# Patient Record
Sex: Female | Born: 1955
Health system: Southern US, Community
[De-identification: ages and names within clinical notes are randomized; demographics above are authoritative.]

## PROBLEM LIST (undated history)

## (undated) DIAGNOSIS — F32A Depression, unspecified: Secondary | ICD-10-CM

## (undated) DIAGNOSIS — F329 Major depressive disorder, single episode, unspecified: Secondary | ICD-10-CM

## (undated) DIAGNOSIS — E119 Type 2 diabetes mellitus without complications: Secondary | ICD-10-CM

## (undated) DIAGNOSIS — G4730 Sleep apnea, unspecified: Secondary | ICD-10-CM

## (undated) DIAGNOSIS — E039 Hypothyroidism, unspecified: Secondary | ICD-10-CM

## (undated) DIAGNOSIS — C801 Malignant (primary) neoplasm, unspecified: Secondary | ICD-10-CM

## (undated) HISTORY — PX: SALPINGECTOMY: SHX328

## (undated) HISTORY — PX: DILATION AND CURETTAGE OF UTERUS: SHX78

---

## 2007-03-29 ENCOUNTER — Emergency Department: Payer: Self-pay | Admitting: Emergency Medicine

## 2007-03-29 ENCOUNTER — Other Ambulatory Visit: Payer: Self-pay

## 2013-08-21 ENCOUNTER — Ambulatory Visit: Payer: Self-pay | Admitting: Family Medicine

## 2015-06-18 ENCOUNTER — Other Ambulatory Visit: Payer: Self-pay | Admitting: Family Medicine

## 2015-06-18 DIAGNOSIS — R1084 Generalized abdominal pain: Secondary | ICD-10-CM

## 2015-06-18 DIAGNOSIS — E038 Other specified hypothyroidism: Secondary | ICD-10-CM

## 2015-06-22 ENCOUNTER — Ambulatory Visit
Admission: RE | Admit: 2015-06-22 | Discharge: 2015-06-22 | Disposition: A | Discharge status: Home / Self Care | Payer: BLUE CROSS/BLUE SHIELD | Source: Ambulatory Visit | Attending: Family Medicine | Admitting: Family Medicine

## 2015-06-22 DIAGNOSIS — K760 Fatty (change of) liver, not elsewhere classified: Secondary | ICD-10-CM | POA: Insufficient documentation

## 2015-06-22 DIAGNOSIS — I2510 Atherosclerotic heart disease of native coronary artery without angina pectoris: Secondary | ICD-10-CM | POA: Insufficient documentation

## 2015-06-22 DIAGNOSIS — R938 Abnormal findings on diagnostic imaging of other specified body structures: Secondary | ICD-10-CM | POA: Insufficient documentation

## 2015-06-22 DIAGNOSIS — R160 Hepatomegaly, not elsewhere classified: Secondary | ICD-10-CM | POA: Diagnosis not present

## 2015-06-22 DIAGNOSIS — R1084 Generalized abdominal pain: Secondary | ICD-10-CM | POA: Diagnosis present

## 2015-06-22 DIAGNOSIS — K449 Diaphragmatic hernia without obstruction or gangrene: Secondary | ICD-10-CM | POA: Diagnosis not present

## 2015-06-22 DIAGNOSIS — E038 Other specified hypothyroidism: Secondary | ICD-10-CM

## 2015-06-22 MED ORDER — IOPAMIDOL (ISOVUE-300) INJECTION 61%
100.0000 mL | Freq: Once | INTRAVENOUS | Status: AC | PRN
  Administered 2015-06-22: 100 mL via INTRAVENOUS

## 2015-09-21 ENCOUNTER — Other Ambulatory Visit: Payer: BLUE CROSS/BLUE SHIELD

## 2015-09-22 ENCOUNTER — Other Ambulatory Visit: Payer: Self-pay

## 2015-09-22 ENCOUNTER — Encounter
Admission: RE | Admit: 2015-09-22 | Discharge: 2015-09-22 | Disposition: A | Discharge status: Home / Self Care | Payer: BLUE CROSS/BLUE SHIELD | Source: Ambulatory Visit | Attending: Obstetrics and Gynecology | Admitting: Obstetrics and Gynecology

## 2015-09-22 DIAGNOSIS — Z01810 Encounter for preprocedural cardiovascular examination: Secondary | ICD-10-CM | POA: Insufficient documentation

## 2015-09-22 DIAGNOSIS — Z01812 Encounter for preprocedural laboratory examination: Secondary | ICD-10-CM | POA: Insufficient documentation

## 2015-09-22 HISTORY — DX: Hypothyroidism, unspecified: E03.9

## 2015-09-22 HISTORY — DX: Type 2 diabetes mellitus without complications: E11.9

## 2015-09-22 HISTORY — DX: Sleep apnea, unspecified: G47.30

## 2015-09-22 LAB — COMPREHENSIVE METABOLIC PANEL
ALK PHOS: 69 U/L (ref 38–126)
ALT: 43 U/L (ref 14–54)
ANION GAP: 8 (ref 5–15)
AST: 32 U/L (ref 15–41)
Albumin: 4.6 g/dL (ref 3.5–5.0)
BILIRUBIN TOTAL: 0.9 mg/dL (ref 0.3–1.2)
BUN: 16 mg/dL (ref 6–20)
CALCIUM: 9.4 mg/dL (ref 8.9–10.3)
CO2: 26 mmol/L (ref 22–32)
Chloride: 109 mmol/L (ref 101–111)
Creatinine, Ser: 0.42 mg/dL — ABNORMAL LOW (ref 0.44–1.00)
Glucose, Bld: 102 mg/dL — ABNORMAL HIGH (ref 65–99)
POTASSIUM: 3.7 mmol/L (ref 3.5–5.1)
Sodium: 143 mmol/L (ref 135–145)
TOTAL PROTEIN: 7.4 g/dL (ref 6.5–8.1)

## 2015-09-22 LAB — CBC
HEMATOCRIT: 46.3 % (ref 35.0–47.0)
HEMOGLOBIN: 15.6 g/dL (ref 12.0–16.0)
MCH: 30.1 pg (ref 26.0–34.0)
MCHC: 33.8 g/dL (ref 32.0–36.0)
MCV: 89.2 fL (ref 80.0–100.0)
Platelets: 269 10*3/uL (ref 150–440)
RBC: 5.19 MIL/uL (ref 3.80–5.20)
RDW: 13.1 % (ref 11.5–14.5)
WBC: 7.5 10*3/uL (ref 3.6–11.0)

## 2015-09-22 LAB — TYPE AND SCREEN
ABO/RH(D): O POS
ANTIBODY SCREEN: NEGATIVE

## 2015-09-22 NOTE — Patient Instructions (Signed)
Your procedure is scheduled on: 10-06-2015 Su procedimiento est programado para: Report to Tina Reynolds. Presntese a: To find out your arrival time please call [phone] between 1PM - 3PM on Monday 10-05-2015 Para saber su hora de llegada por favor llame al ([place]:  Remember: Instructions that are not followed completely may result in serious medical risk, up to and including death, or upon the discretion of your surgeon and anesthesiologist your surgery may need to be rescheduled.  Recuerde: Las instrucciones que no se siguen completamente Heritage manager en un riesgo de salud grave, incluyendo hasta la [place] o a discrecin de su cirujano y Environmental health practitioner, su ciruga se puede posponer.   __X__ 1. Do not eat food or drink liquids after midnight. No gum chewing or hard candies.  No coma alimentos ni tome lquidos despus de la medianoche.  No mastique chicle ni caramelos  duros.     __X__ 2. No alcohol for 24 hours before or after surgery.    No tome alcohol durante las 24 horas antes ni despus de la [place].   _X___ 3. Bring all medications with you on the day of surgery if instructed.    Lleve todos los medicamentos con usted el da de su ciruga si se le ha indicado as.   __X__ 4. Notify your doctor if there is any change in your medical condition (cold, fever,                             infections).    Informe a su mdico si hay algn cambio en su condicin mdica (resfriado, fiebre, infecciones).   Do not wear jewelry, make-up, hairpins, clips or nail polish.  No use joyas, maquillajes, pinzas/ganchos para el cabello ni esmalte de uas.  Do not wear lotions, powders, or perfumes. You may wear deodorant.  No use lociones, polvos o perfumes.  Puede usar desodorante.    Do not shave 48 hours prior to surgery. Men may shave face and neck.  No se afeite 48 horas antes de la [place].  Los  hombres pueden [employer] cara y el cuello.   Do not bring valuables to the hospital.   No lleve objetos [facility] is not responsible for any belongings or valuables.  [place] no se hace responsable de ningn tipo de pertenencias u objetos de Geographical information systems officer.               Contacts, dentures or bridgework may not be worn into surgery.  Los lentes de [place], las dentaduras postizas o puentes no se pueden usar en la [place].  Leave your suitcase in the car. After surgery it may be brought to your room.  Deje su maleta en el auto.  Despus de la ciruga podr traerla a su habitacin.  For patients admitted to the hospital, discharge time is determined by your treatment team.  Para los pacientes que sean ingresados al hospital, el tiempo en el cual se le dar de alta es determinado por su                equipo de [place].   Patients discharged the day of surgery will not be allowed to drive home. A los pacientes que se les da de alta el mismo da de la ciruga no se les permitir conducir a Holiday representative.  Please read over the following fact sheets that you were given: Por favor [place] informacin que le dieron:   Surgical Site Infection Prevention   _X___ Take these medicines the morning of surgery with A SIP OF WATER:          [employer] estas medicinas la maana de la ciruga con UN SORBO DE AGUA:  1.atorvastatin (LIPITOR) 40 MG tablet  2. citalopram (CELEXA              3. fenofibrate (TRICOR)      4.levothyroxine (SYNTHROID,            Fleet Enema (as directed)          Enema de [place] (segn lo indicado)    ____ Use CHG Soap as directed          Utilice el jabn de CHG segn lo indicado  ____ Use inhalers on the day of surgery          Use los inhaladores el da de la ciruga  ____ Stop metformin 2 days prior to surgery          Deje de tomar el metformin 2 das antes de la ciruga    ____ Take 1/2 of usual insulin dose the night before  surgery and none on the morning of surgery           Tome la mitad de la dosis habitual de insulina la noche antes de la [place] y no tome nada en la maana de la             ciruga  ____ Stop Coumadin/Plavix/aspirin on        Deje de tomar el Coumadin/Plavix/aspirina el da:  ___X_ Stop Anti-inflammatories TODAY NO ADVIL MOTRIN, IBUPROFEN OR ALEVE --- ONLY TAKE TYLENOL          Deje de tomar antiinflamatorios el da:   ____ Stop supplements until after surgery            Deje de tomar suplementos hasta despus de la ciruga  ____ Bring C-Pap to the hospital          [facility]

## 2015-09-22 NOTE — Pre-Procedure Instructions (Signed)
MEDICAL CLEARANCE REQUEST/ EKG AS INSTRUCTED BY DR Linden FAXED TO DR Casimer Lanius. ALSO CALLED AND FAXED TO [place]

## 2015-09-29 NOTE — Pre-Procedure Instructions (Signed)
Medical clearance received from Dr. Lestine Box, and patient cleared at low risk for surgery.

## 2015-10-06 ENCOUNTER — Ambulatory Visit: Payer: MEDICAID | Admitting: Anesthesiology

## 2015-10-06 ENCOUNTER — Encounter
Admission: RE | Disposition: A | Discharge status: Home / Self Care | Payer: Self-pay | Source: Ambulatory Visit | Attending: Obstetrics and Gynecology

## 2015-10-06 ENCOUNTER — Ambulatory Visit
Admission: RE | Admit: 2015-10-06 | Discharge: 2015-10-06 | Disposition: A | Discharge status: Home / Self Care | Payer: MEDICAID | Source: Ambulatory Visit | Attending: Obstetrics and Gynecology | Admitting: Obstetrics and Gynecology

## 2015-10-06 ENCOUNTER — Encounter: Payer: Self-pay | Admitting: *Deleted

## 2015-10-06 DIAGNOSIS — Z79899 Other long term (current) drug therapy: Secondary | ICD-10-CM | POA: Insufficient documentation

## 2015-10-06 DIAGNOSIS — N840 Polyp of corpus uteri: Secondary | ICD-10-CM | POA: Diagnosis present

## 2015-10-06 DIAGNOSIS — F329 Major depressive disorder, single episode, unspecified: Secondary | ICD-10-CM | POA: Insufficient documentation

## 2015-10-06 DIAGNOSIS — N841 Polyp of cervix uteri: Secondary | ICD-10-CM | POA: Diagnosis present

## 2015-10-06 DIAGNOSIS — E039 Hypothyroidism, unspecified: Secondary | ICD-10-CM | POA: Insufficient documentation

## 2015-10-06 DIAGNOSIS — E785 Hyperlipidemia, unspecified: Secondary | ICD-10-CM | POA: Insufficient documentation

## 2015-10-06 HISTORY — PX: DILATATION & CURETTAGE/HYSTEROSCOPY WITH MYOSURE: SHX6511

## 2015-10-06 LAB — GLUCOSE, CAPILLARY: Glucose-Capillary: 90 mg/dL (ref 65–99)

## 2015-10-06 SURGERY — DILATATION & CURETTAGE/HYSTEROSCOPY WITH MYOSURE
Anesthesia: General

## 2015-10-06 MED ORDER — FENTANYL CITRATE (PF) 100 MCG/2ML IJ SOLN
INTRAMUSCULAR | Status: DC | PRN
  Administered 2015-10-06: 25 ug via INTRAVENOUS
  Administered 2015-10-06: 12.5 ug via INTRAVENOUS

## 2015-10-06 MED ORDER — FAMOTIDINE 20 MG PO TABS
20.0000 mg | ORAL_TABLET | Freq: Once | ORAL | Status: AC
  Administered 2015-10-06: 20 mg via ORAL

## 2015-10-06 MED ORDER — OXYCODONE HCL 5 MG/5ML PO SOLN: 5.0000 mg | Freq: Once | ORAL | Status: DC | PRN

## 2015-10-06 MED ORDER — OXYCODONE HCL 5 MG PO TABS: 5.0000 mg | ORAL_TABLET | Freq: Once | ORAL | Status: DC | PRN

## 2015-10-06 MED ORDER — FAMOTIDINE 20 MG PO TABS
ORAL_TABLET | ORAL | Status: AC
  Administered 2015-10-06: 20 mg via ORAL
  Filled 2015-10-06: qty 1

## 2015-10-06 MED ORDER — MIDAZOLAM HCL 2 MG/2ML IJ SOLN
INTRAMUSCULAR | Status: DC | PRN
  Administered 2015-10-06: 1 mg via INTRAVENOUS

## 2015-10-06 MED ORDER — LIDOCAINE HCL (CARDIAC) 20 MG/ML IV SOLN
INTRAVENOUS | Status: DC | PRN
  Administered 2015-10-06: 80 mg via INTRAVENOUS

## 2015-10-06 MED ORDER — PROPOFOL 10 MG/ML IV BOLUS
INTRAVENOUS | Status: DC | PRN
  Administered 2015-10-06: 150 mg via INTRAVENOUS

## 2015-10-06 MED ORDER — SILVER NITRATE-POT NITRATE 75-25 % EX MISC
CUTANEOUS | Status: DC | PRN
  Administered 2015-10-06: 2

## 2015-10-06 MED ORDER — FENTANYL CITRATE (PF) 100 MCG/2ML IJ SOLN
25.0000 ug | INTRAMUSCULAR | Status: DC | PRN
  Administered 2015-10-06 (×2): 25 ug via INTRAVENOUS

## 2015-10-06 MED ORDER — ACETAMINOPHEN-CODEINE #3 300-30 MG PO TABS: 1.0000 | ORAL_TABLET | Freq: Four times a day (QID) | ORAL | 0 refills | Status: DC | PRN

## 2015-10-06 MED ORDER — IBUPROFEN 600 MG PO TABS: 600.0000 mg | ORAL_TABLET | Freq: Four times a day (QID) | ORAL | Status: DC | PRN

## 2015-10-06 MED ORDER — SODIUM CHLORIDE 0.9 % IV SOLN
INTRAVENOUS | Status: DC
  Administered 2015-10-06: 07:00:00 via INTRAVENOUS

## 2015-10-06 MED ORDER — ONDANSETRON HCL 4 MG/2ML IJ SOLN
INTRAMUSCULAR | Status: DC | PRN
  Administered 2015-10-06: 4 mg via INTRAVENOUS

## 2015-10-06 MED ORDER — LACTATED RINGERS IV SOLN: INTRAVENOUS | Status: DC

## 2015-10-06 MED ORDER — FENTANYL CITRATE (PF) 100 MCG/2ML IJ SOLN
INTRAMUSCULAR | Status: AC
  Administered 2015-10-06: 25 ug via INTRAVENOUS
  Filled 2015-10-06: qty 2

## 2015-10-06 MED ORDER — ACETAMINOPHEN 10 MG/ML IV SOLN
INTRAVENOUS | Status: AC
  Filled 2015-10-06: qty 100

## 2015-10-06 MED ORDER — ACETAMINOPHEN 10 MG/ML IV SOLN
INTRAVENOUS | Status: DC | PRN
  Administered 2015-10-06: 1000 mg via INTRAVENOUS

## 2015-10-06 SURGICAL SUPPLY — 20 items
ABLATOR ENDOMETRIAL MYOSURE (ABLATOR) IMPLANT
CANISTER SUC SOCK COL 7IN (MISCELLANEOUS) ×3 IMPLANT
CATH ROBINSON RED A/P 16FR (CATHETERS) ×3 IMPLANT
DEVICE MYOSURE LITE (MISCELLANEOUS) ×3 IMPLANT
ELECT REM PT RETURN 9FT ADLT (ELECTROSURGICAL) ×3
ELECTRODE REM PT RTRN 9FT ADLT (ELECTROSURGICAL) ×1 IMPLANT
GLOVE BIO SURGEON STRL SZ7 (GLOVE) ×12 IMPLANT
GLOVE BIOGEL PI IND STRL 7.5 (GLOVE) ×1 IMPLANT
GLOVE BIOGEL PI INDICATOR 7.5 (GLOVE) ×2
GOWN STRL REUS W/ TWL LRG LVL3 (GOWN DISPOSABLE) ×1 IMPLANT
GOWN STRL REUS W/TWL LRG LVL3 (GOWN DISPOSABLE) ×2
IV LACTATED RINGER IRRG 3000ML (IV SOLUTION) ×2
IV LR IRRIG 3000ML ARTHROMATIC (IV SOLUTION) ×1 IMPLANT
KIT RM TURNOVER CYSTO AR (KITS) ×12 IMPLANT
PACK DNC HYST (MISCELLANEOUS) ×3 IMPLANT
PAD OB MATERNITY 4.3X12.25 (PERSONAL CARE ITEMS) ×3 IMPLANT
PAD PREP 24X41 OB/GYN DISP (PERSONAL CARE ITEMS) ×3 IMPLANT
TUBING CONNECTING 10 (TUBING) ×2 IMPLANT
TUBING CONNECTING 10' (TUBING) ×1
TUBING HYSTEROSCOPY DOLPHIN (MISCELLANEOUS) ×3 IMPLANT

## 2015-10-06 NOTE — Transfer of Care (Signed)
Immediate Anesthesia Transfer of Care Note  Patient: Tina Reynolds  Procedure(s) Performed: Procedure(s): DILATATION & CURETTAGE/HYSTEROSCOPY WITH MYOSURE (N/A)  Patient Location: PACU  Anesthesia Type:General  Level of Consciousness: awake and alert   Airway & Oxygen Therapy: Patient Spontanous Breathing and Patient connected to face mask oxygen  Post-op Assessment: Report given to RN and Post -op Vital signs reviewed and stable  Post vital signs: Reviewed and stable  Last Vitals:  Vitals:   10/06/15 0830 10/06/15 0834  BP: 130/76 130/76  Pulse: (!) 59 (!) 58  Resp: 18 14  Temp: 36.8 C     Last Pain:  Vitals:   10/06/15 0631  TempSrc: Oral         Complications: No apparent anesthesia complications

## 2015-10-06 NOTE — H&P (Signed)
History and Physical Interval Note:  Tina Reynolds  has presented today for surgery, with the diagnosis of endometrial polyps  The various methods of treatment have been discussed with the patient and family. After consideration of risks, benefits and other options for treatment, the patient has consented to  Procedure(s): [place] (N/A) as a surgical intervention .  The patient's history has been reviewed, patient examined, no change in status, stable for surgery.  I have reviewed the patient's chart and labs.  Questions were answered to the patient's satisfaction.    Will Bonnet, MD 10/06/2015 7:28 AM

## 2015-10-06 NOTE — Discharge Instructions (Signed)

## 2015-10-06 NOTE — Op Note (Signed)
Operative Note   10/06/2015  PRE-OP DIAGNOSIS: endometrial polyps   POST-OP DIAGNOSIS: endometrial polyps   SURGEON: Surgeon(s) and Role:    * Will Bonnet, MD - Primary  PROCEDURE: Procedure(s): DILATATION & CURETTAGE/HYSTEROSCOPY WITH MYOSURE   ANESTHESIA: General endotracheal  ESTIMATED BLOOD LOSS: 25 mL  DRAINS: None   TOTAL IV FLUIDS: 400 mL  SPECIMENS:  Endometrial and endocervical  VTE PROPHYLAXIS: SCDs to the bilateral lower extremities  ANTIBIOTICS: None indicated  FLUID DEFICIT: Minimal  COMPLICATIONS: None  DISPOSITION: PACU - hemodynamically stable.  CONDITION: stable  FINDINGS: Exam under anesthesia revealed small, mobile retroverted uterus with no masses and bilateral adnexa without masses or fullness. Hysteroscopy revealed a grossly normal appearing uterine cavity with bilateral tubal ostia and normal appearing endocervical canal. She had two small polypoid lesions in her uterus, the first anteriorly and the second anterior and lateral.  She also had 3 small endocervical polypoid lesions.  PROCEDURE IN DETAIL:  After informed consent was obtained, the patient was taken to the operating room where anesthesia was obtained without difficulty. The patient was positioned in the dorsal lithotomy position in [place].  The patient's bladder was catheterized with an in and out foley catheter.  The patient was examined under anesthesia, with the above noted findings.  The bi-valved speculum was placed inside the patient's vagina, and the the anterior lip of the cervix was seen and grasped with the tenaculum.  The cervix was progressively dilated to a 6 mm Hegar dilator.  The hysteroscope was introduced, with the above noted findings. The myosure device was utilized to remove the polyps. The myosure device was used to remove the cervical polyps. The hysteroscope was removed and a gentle curettage was performed with return of small amount of endometrium.   Excellent hemostasis was noted, and all instruments were removed, with excellent hemostasis noted throughout.  She was then taken out of dorsal lithotomy. The patient tolerated the procedure well.  Sponge, lap and needle counts were correct x2.  The patient was taken to recovery room in excellent condition.  Will Bonnet, MD 10/06/2015 8:16 AM

## 2015-10-06 NOTE — Anesthesia Preprocedure Evaluation (Signed)
Anesthesia Evaluation  Patient identified by MRN, date of birth, ID band Patient awake    Reviewed: Allergy & Precautions, H&P , NPO status , Patient's Chart, lab work & pertinent test results  Airway Mallampati: III  TM Distance: >3 FB Neck ROM: limited    Dental  (+) Poor Dentition, Missing, Upper Dentures, Lower Dentures   Pulmonary neg shortness of breath, sleep apnea ,    Pulmonary exam normal breath sounds clear to auscultation       Cardiovascular Exercise Tolerance: Good (-) angina(-) Past MI and (-) DOE negative cardio ROS Normal cardiovascular exam Rhythm:regular Rate:Normal     Neuro/Psych negative neurological ROS  negative psych ROS   GI/Hepatic negative GI ROS, Neg liver ROS,   Endo/Other  diabetes, Type 2Hypothyroidism   Renal/GU negative Renal ROS  negative genitourinary   Musculoskeletal   Abdominal   Peds  Hematology negative hematology ROS (+)   Anesthesia Other Findings Past Medical History: No date: Diabetes mellitus without complication (HCC)     Comment: pre diabetic No date: Hypothyroidism No date: Sleep apnea  Past Surgical History: No date: DILATION AND CURETTAGE OF UTERUS  BMI    Body Mass Index:  29.89 kg/m      Reproductive/Obstetrics negative OB ROS                             Anesthesia Physical Anesthesia Plan  ASA: III  Anesthesia Plan: General LMA   Post-op Pain Management:    Induction:   Airway Management Planned:   Additional Equipment:   Intra-op Plan:   Post-operative Plan:   Informed Consent: I have reviewed the patients History and Physical, chart, labs and discussed the procedure including the risks, benefits and alternatives for the proposed anesthesia with the patient or authorized representative who has indicated his/her understanding and acceptance.   Dental Advisory Given  Plan Discussed with: Anesthesiologist, CRNA  and Surgeon  Anesthesia Plan Comments: (Patient declines interpreter )        Anesthesia Quick Evaluation

## 2015-10-06 NOTE — Anesthesia Procedure Notes (Signed)
Procedure Name: LMA Insertion Performed by: Chancy Claros Pre-anesthesia Checklist: Patient identified, Patient being monitored, Timeout performed, Emergency Drugs available and Suction available Patient Re-evaluated:Patient Re-evaluated prior to inductionOxygen Delivery Method: Circle system utilized Preoxygenation: Pre-oxygenation with 100% oxygen Intubation Type: IV induction Ventilation: Mask ventilation without difficulty LMA: LMA inserted LMA Size: 3.0 Tube type: Oral Number of attempts: 1 Placement Confirmation: positive ETCO2 and breath sounds checked- equal and bilateral Tube secured with: Tape Dental Injury: Teeth and Oropharynx as per pre-operative assessment        

## 2015-10-06 NOTE — Anesthesia Postprocedure Evaluation (Signed)
Anesthesia Post Note  Patient: Tina Reynolds  Procedure(s) Performed: Procedure(s) (LRB): DILATATION & CURETTAGE/HYSTEROSCOPY WITH MYOSURE/polypectomy (N/A)  Patient location during evaluation: PACU Anesthesia Type: General Level of consciousness: awake and alert Pain management: pain level controlled Vital Signs Assessment: post-procedure vital signs reviewed and stable Respiratory status: spontaneous breathing, nonlabored ventilation, respiratory function stable and patient connected to nasal cannula oxygen Cardiovascular status: blood pressure returned to baseline and stable Postop Assessment: no signs of nausea or vomiting Anesthetic complications: no    Last Vitals:  Vitals:   10/06/15 0921 10/06/15 0930  BP: (!) 143/72 135/68  Pulse: (!) 45 (!) 46  Resp: 14 14  Temp: 36.4 C     Last Pain:  Vitals:   10/06/15 0915  TempSrc:   PainSc: 0-No pain                 Precious Haws Piscitello

## 2015-10-07 LAB — SURGICAL PATHOLOGY

## 2015-12-17 ENCOUNTER — Other Ambulatory Visit: Payer: Self-pay | Admitting: Family Medicine

## 2015-12-17 DIAGNOSIS — Z1239 Encounter for other screening for malignant neoplasm of breast: Secondary | ICD-10-CM

## 2016-06-30 IMAGING — CT CT ABD-PELV W/ CM
2 of 5 series · 16 of 46 positions shown, 18 images · IV contrast (iopamidol)
Comparison: None.

CLINICAL DATA: Right lower quadrant pain for years. Vaginal
discharge. Right lower quadrant pain after intercourse.

EXAM:
CT ABDOMEN AND PELVIS WITH CONTRAST
TECHNIQUE: Multidetector CT imaging of the abdomen and pelvis was performed
using the standard protocol following bolus administration of
intravenous contrast.
CONTRAST:  100mL APKZ6H-O11 IOPAMIDOL (APKZ6H-O11) INJECTION 61%

[Series 2: axial soft tissue · axial · 0.77mm/px · z∈[-763,-358]mm · 13 of 91 slices shown, 15 images]
[im 5/91  soft-tissue]
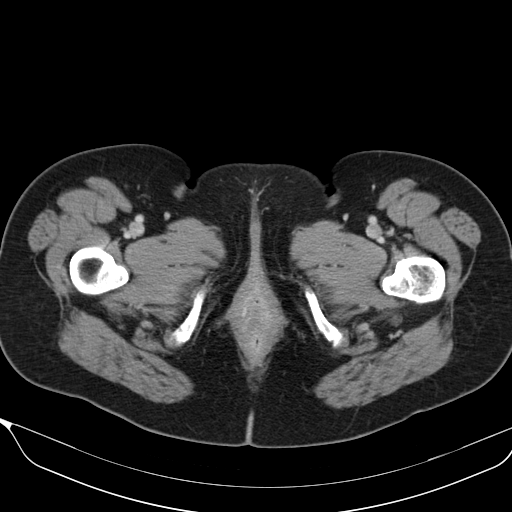
[im 5/91  bone]
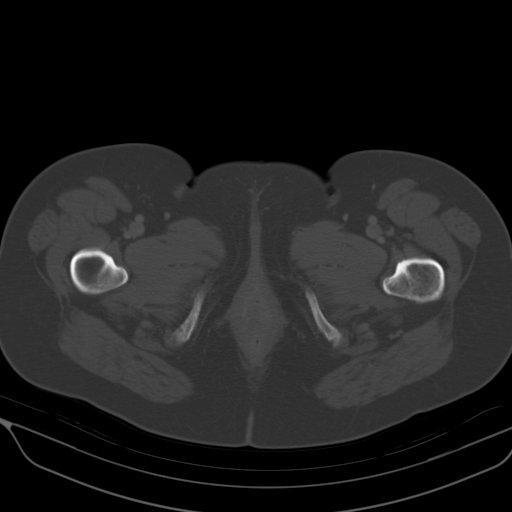
[im 14/91  soft-tissue]
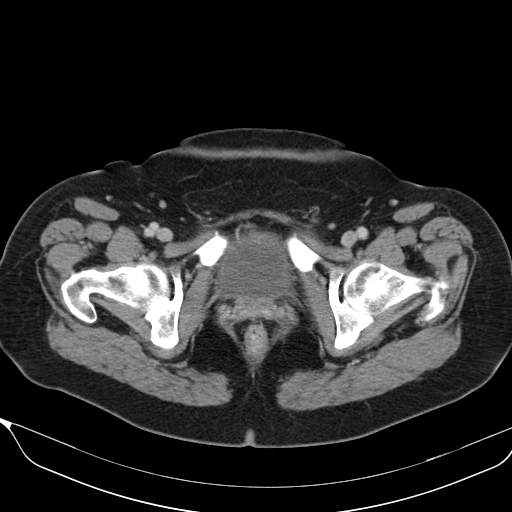
[im 19/91  soft-tissue]
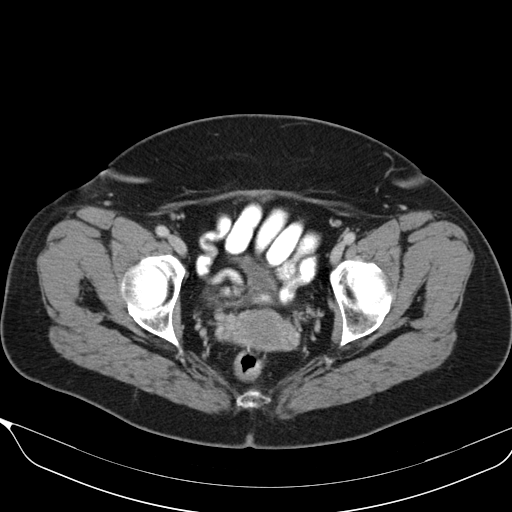
[im 28/91  soft-tissue]
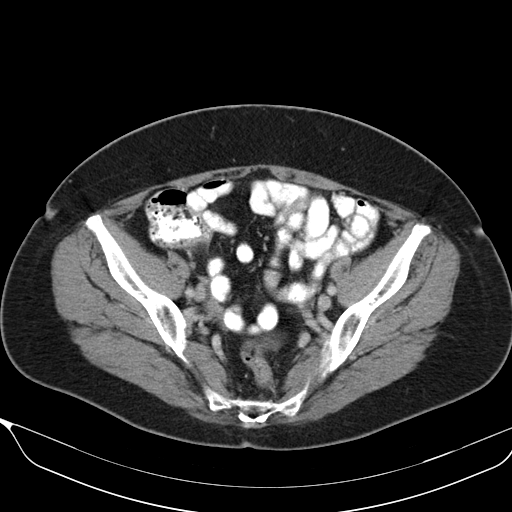
[im 32/91  soft-tissue]
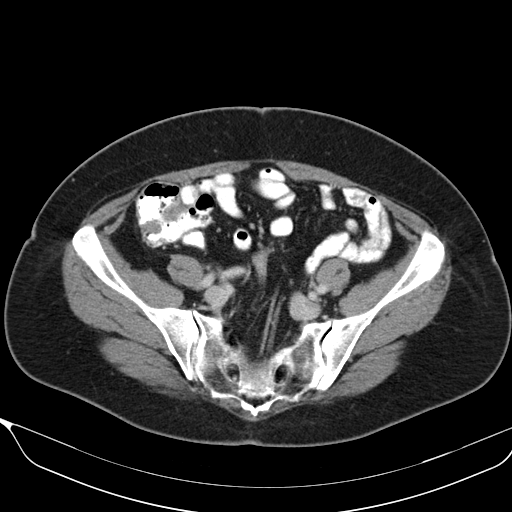
[im 41/91  soft-tissue]
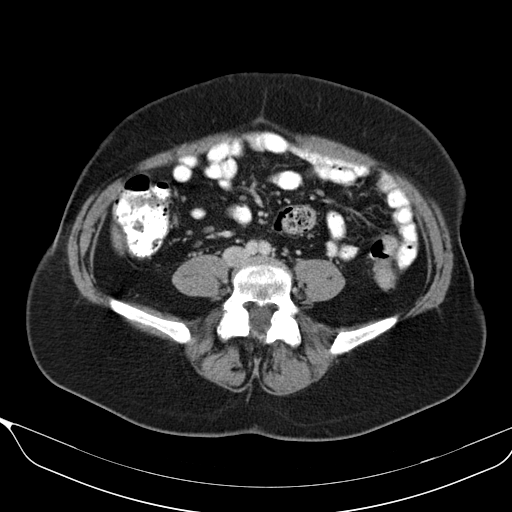
[im 46/91  soft-tissue]
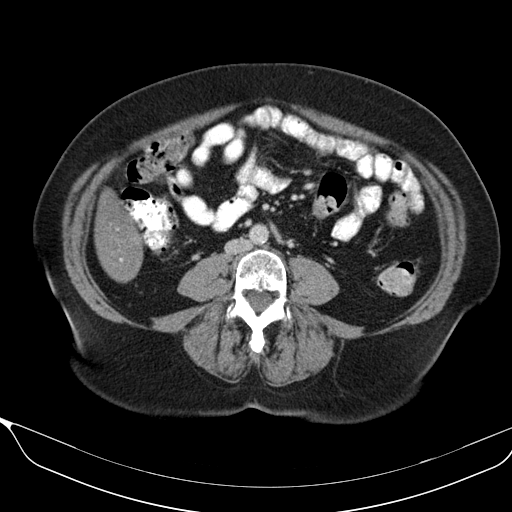
[im 50/91  soft-tissue]
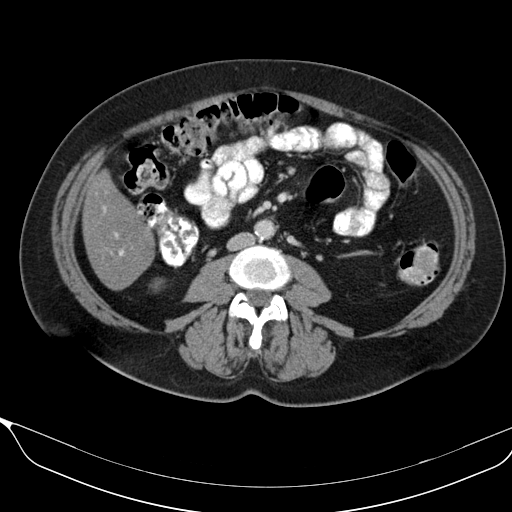
[im 59/91  soft-tissue]
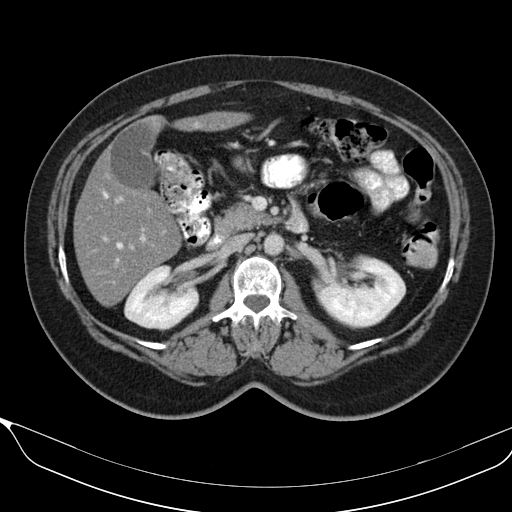
[im 59/91  bone]
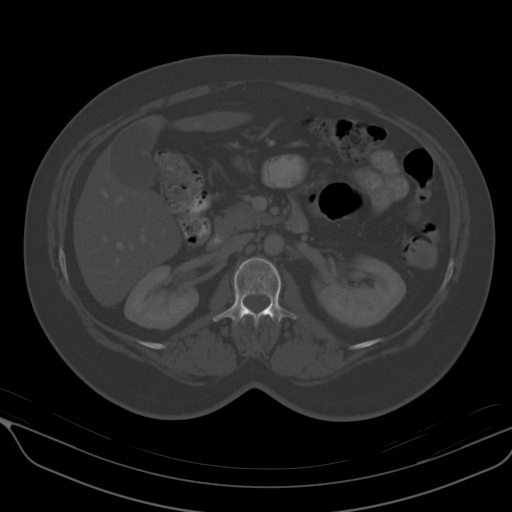
[im 64/91  soft-tissue]
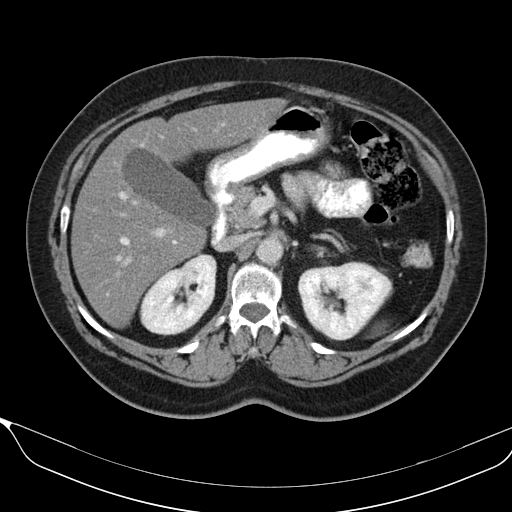
[im 73/91  soft-tissue]
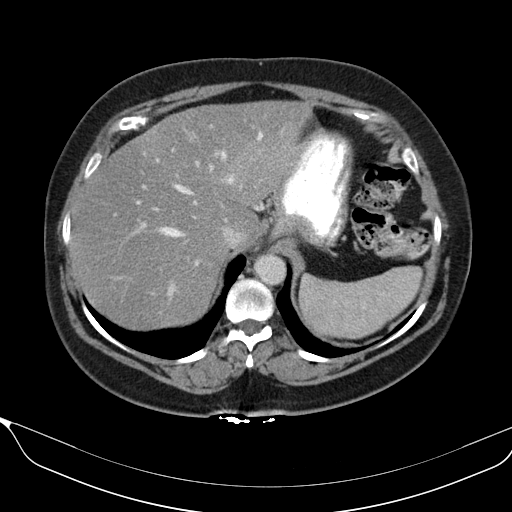
[im 77/91  soft-tissue]
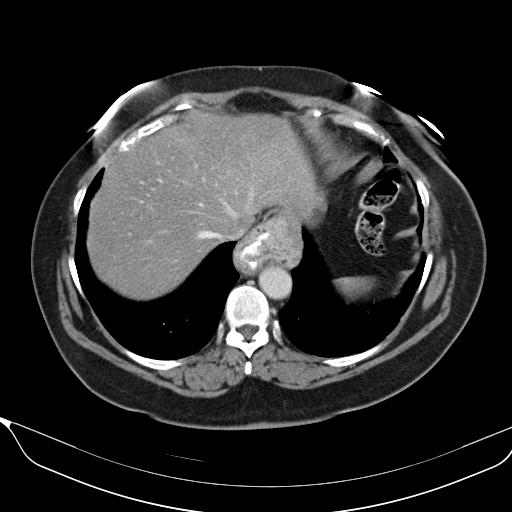
[im 86/91  soft-tissue]
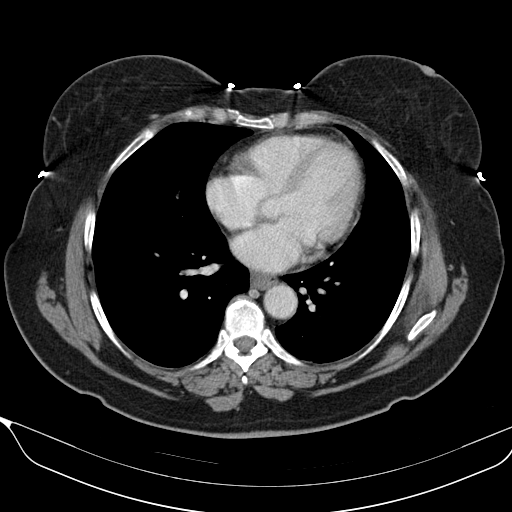

[Series 602: coronal · coronal · 0.88mm/px · 3 of 96 slices shown]
[im 32/96  soft-tissue]
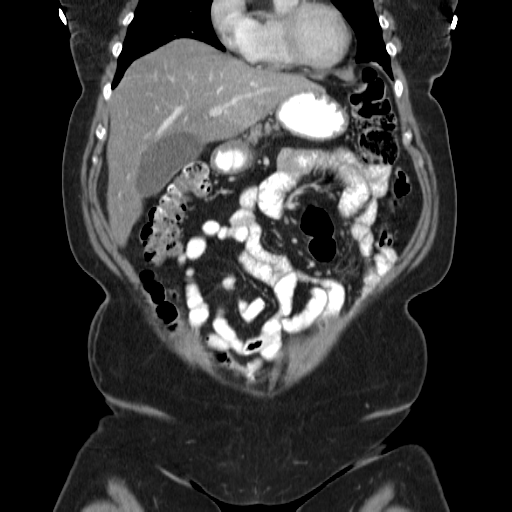
[im 43/96  soft-tissue]
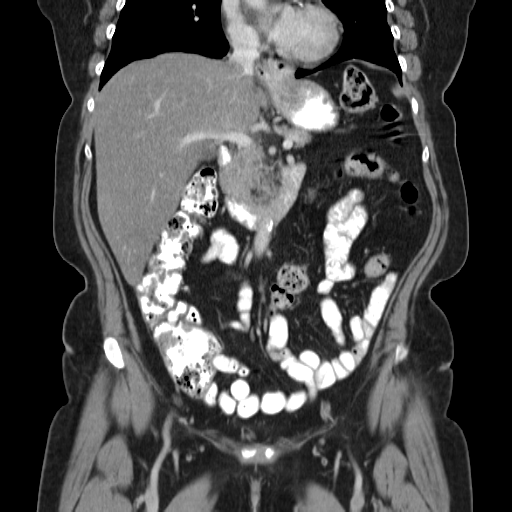
[im 53/96  soft-tissue]
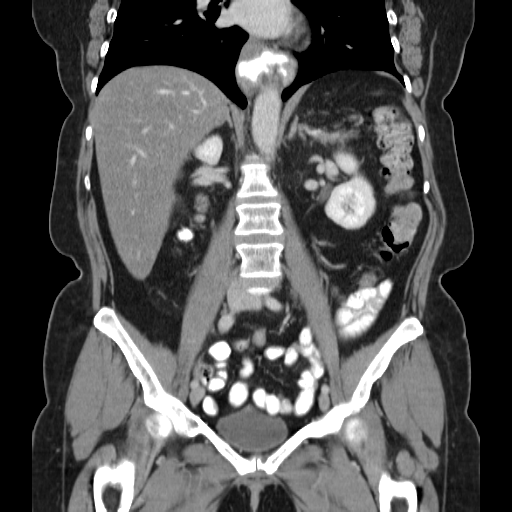

[16 of 46 positions shown; findings below may reference images not displayed]

FINDINGS: Lower chest: Clear lung bases. Normal heart size without pericardial
or pleural effusion. A moderate hiatal hernia. Left circumflex
coronary artery atherosclerosis on image 1/ series 2.

Hepatobiliary: Hepatic steatosis and hepatomegaly are moderate. No
focal liver lesion. Normal gallbladder, without biliary ductal
dilatation.

Pancreas: Normal, without mass or ductal dilatation.

Spleen: Normal in size, without focal abnormality.

Adrenals/Urinary Tract: Normal adrenal glands. Too small to
characterize interpolar left renal lesion. Normal right kidney,
without hydronephrosis or hydroureter. Normal urinary bladder.

Stomach/Bowel: Normal remainder of the stomach. Minimal motion
degradation in the upper abdomen. Colonic stool burden suggests
constipation. Normal terminal ileum. Appendix is not visualized but
there is no evidence of right lower quadrant inflammation. Normal
small bowel.

Vascular/Lymphatic: Aortic and branch vessel atherosclerosis. No
abdominopelvic adenopathy.

Reproductive: 12 mm hypoattenuation within the central uterus likely
represents endometrial thickening, including on sagittal image 70
and transverse image 68/series 2. Hypoattenuation within the cervix,
without well-defined cervical mass. No adnexal mass.

Other: No significant free fluid. Mild pelvic floor laxity. No
evidence of omental or peritoneal disease.

Musculoskeletal: Degenerative disc disease at the lumbosacral
junction.
IMPRESSION: 1. Abnormal appearance of the uterus. Central hypoattenuation within
the uterus is likely related to endometrial thickening.
Hypoattenuation within the cervix without well-defined cervical
mass. Differential considerations include endometrial carcinoma,
endometrial hyperplasia, or cervical stenosis/ mass. Further
evaluation with pelvic ultrasound suggested.
2. Possible constipation. No other explanation for right lower
quadrant pain.
3. Mild pelvic floor laxity.
4. Hepatic steatosis and hepatomegaly.
5. Hiatal hernia.
6. Age advanced coronary artery atherosclerosis. Recommend
assessment of coronary risk factors and consideration of medical
therapy.
These results will be called to the ordering clinician or
representative by the Radiologist Assistant, and communication
documented in the PACS or zVision Dashboard.

## 2016-09-23 ENCOUNTER — Encounter: Payer: Self-pay | Admitting: *Deleted

## 2016-09-26 ENCOUNTER — Ambulatory Visit: Admit: 2016-09-26 | Payer: MEDICAID | Admitting: Gastroenterology

## 2016-09-26 HISTORY — DX: Malignant (primary) neoplasm, unspecified: C80.1

## 2016-09-26 HISTORY — DX: Major depressive disorder, single episode, unspecified: F32.9

## 2016-09-26 HISTORY — DX: Depression, unspecified: F32.A

## 2016-09-26 SURGERY — COLONOSCOPY WITH PROPOFOL
Anesthesia: General

## 2016-09-27 ENCOUNTER — Ambulatory Visit: Admit: 2016-09-27 | Payer: MEDICAID | Admitting: Gastroenterology

## 2018-12-27 DIAGNOSIS — H25013 Cortical age-related cataract, bilateral: Secondary | ICD-10-CM | POA: Diagnosis not present

## 2019-01-02 DIAGNOSIS — L578 Other skin changes due to chronic exposure to nonionizing radiation: Secondary | ICD-10-CM | POA: Diagnosis not present

## 2019-01-02 DIAGNOSIS — L821 Other seborrheic keratosis: Secondary | ICD-10-CM | POA: Diagnosis not present

## 2019-01-02 DIAGNOSIS — L918 Other hypertrophic disorders of the skin: Secondary | ICD-10-CM | POA: Diagnosis not present

## 2019-01-02 DIAGNOSIS — L570 Actinic keratosis: Secondary | ICD-10-CM | POA: Diagnosis not present

## 2019-01-02 DIAGNOSIS — L648 Other androgenic alopecia: Secondary | ICD-10-CM | POA: Diagnosis not present

## 2019-02-01 DIAGNOSIS — Z1231 Encounter for screening mammogram for malignant neoplasm of breast: Secondary | ICD-10-CM | POA: Diagnosis not present

## 2019-03-04 DIAGNOSIS — L648 Other androgenic alopecia: Secondary | ICD-10-CM | POA: Diagnosis not present

## 2019-04-04 DIAGNOSIS — D485 Neoplasm of uncertain behavior of skin: Secondary | ICD-10-CM | POA: Diagnosis not present

## 2019-04-04 DIAGNOSIS — L648 Other androgenic alopecia: Secondary | ICD-10-CM | POA: Diagnosis not present

## 2019-04-04 DIAGNOSIS — C441121 Basal cell carcinoma of skin of right upper eyelid, including canthus: Secondary | ICD-10-CM | POA: Diagnosis not present

## 2019-05-24 DIAGNOSIS — Z0000 Encounter for general adult medical examination without abnormal findings: Secondary | ICD-10-CM | POA: Diagnosis not present

## 2019-05-24 DIAGNOSIS — E119 Type 2 diabetes mellitus without complications: Secondary | ICD-10-CM | POA: Diagnosis not present

## 2019-05-24 DIAGNOSIS — E034 Atrophy of thyroid (acquired): Secondary | ICD-10-CM | POA: Diagnosis not present

## 2019-05-24 DIAGNOSIS — E782 Mixed hyperlipidemia: Secondary | ICD-10-CM | POA: Diagnosis not present

## 2019-05-24 DIAGNOSIS — Z124 Encounter for screening for malignant neoplasm of cervix: Secondary | ICD-10-CM | POA: Diagnosis not present

## 2019-05-24 DIAGNOSIS — F329 Major depressive disorder, single episode, unspecified: Secondary | ICD-10-CM | POA: Diagnosis not present

## 2019-06-03 DIAGNOSIS — G4733 Obstructive sleep apnea (adult) (pediatric): Secondary | ICD-10-CM | POA: Diagnosis not present

## 2019-06-18 DIAGNOSIS — G8929 Other chronic pain: Secondary | ICD-10-CM | POA: Diagnosis not present

## 2019-06-18 DIAGNOSIS — H6121 Impacted cerumen, right ear: Secondary | ICD-10-CM | POA: Diagnosis not present

## 2019-06-18 DIAGNOSIS — R1013 Epigastric pain: Secondary | ICD-10-CM | POA: Diagnosis not present

## 2019-06-27 DIAGNOSIS — C44319 Basal cell carcinoma of skin of other parts of face: Secondary | ICD-10-CM | POA: Diagnosis not present

## 2019-07-11 DIAGNOSIS — R1013 Epigastric pain: Secondary | ICD-10-CM | POA: Diagnosis not present

## 2019-07-11 DIAGNOSIS — G8929 Other chronic pain: Secondary | ICD-10-CM | POA: Diagnosis not present

## 2019-07-11 DIAGNOSIS — Z23 Encounter for immunization: Secondary | ICD-10-CM | POA: Diagnosis not present

## 2019-07-11 DIAGNOSIS — Z6833 Body mass index (BMI) 33.0-33.9, adult: Secondary | ICD-10-CM | POA: Diagnosis not present

## 2019-07-11 DIAGNOSIS — E119 Type 2 diabetes mellitus without complications: Secondary | ICD-10-CM | POA: Diagnosis not present

## 2020-02-19 DIAGNOSIS — H40003 Preglaucoma, unspecified, bilateral: Secondary | ICD-10-CM | POA: Diagnosis not present

## 2020-02-19 DIAGNOSIS — E119 Type 2 diabetes mellitus without complications: Secondary | ICD-10-CM | POA: Diagnosis not present

## 2020-03-24 DIAGNOSIS — R030 Elevated blood-pressure reading, without diagnosis of hypertension: Secondary | ICD-10-CM | POA: Diagnosis not present

## 2020-03-24 DIAGNOSIS — E669 Obesity, unspecified: Secondary | ICD-10-CM | POA: Diagnosis not present

## 2020-03-24 DIAGNOSIS — E119 Type 2 diabetes mellitus without complications: Secondary | ICD-10-CM | POA: Diagnosis not present

## 2020-03-24 DIAGNOSIS — Z23 Encounter for immunization: Secondary | ICD-10-CM | POA: Diagnosis not present

## 2020-04-23 ENCOUNTER — Other Ambulatory Visit: Payer: Self-pay

## 2020-04-23 ENCOUNTER — Ambulatory Visit
Admission: EM | Admit: 2020-04-23 | Discharge: 2020-04-23 | Disposition: A | Discharge status: Home / Self Care | Payer: PPO | Attending: Family Medicine | Admitting: Family Medicine

## 2020-04-23 ENCOUNTER — Ambulatory Visit (INDEPENDENT_AMBULATORY_CARE_PROVIDER_SITE_OTHER): Payer: PPO

## 2020-04-23 DIAGNOSIS — M79604 Pain in right leg: Secondary | ICD-10-CM

## 2020-04-23 DIAGNOSIS — E669 Obesity, unspecified: Secondary | ICD-10-CM | POA: Diagnosis not present

## 2020-04-23 DIAGNOSIS — E119 Type 2 diabetes mellitus without complications: Secondary | ICD-10-CM | POA: Diagnosis not present

## 2020-04-23 DIAGNOSIS — E034 Atrophy of thyroid (acquired): Secondary | ICD-10-CM | POA: Diagnosis not present

## 2020-04-23 DIAGNOSIS — Z041 Encounter for examination and observation following transport accident: Secondary | ICD-10-CM | POA: Diagnosis not present

## 2020-04-23 MED ORDER — NAPROXEN 375 MG PO TABS: 375.0000 mg | ORAL_TABLET | Freq: Two times a day (BID) | ORAL | 0 refills | Status: DC | PRN

## 2020-04-23 NOTE — Discharge Instructions (Signed)
Rest.  Ice.  Medication as needed.  Take care  Dr. Lacinda Axon
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
## 2020-04-27 ENCOUNTER — Ambulatory Visit
Admission: EM | Admit: 2020-04-27 | Discharge: 2020-04-27 | Disposition: A | Discharge status: Home / Self Care | Payer: MEDICAID

## 2020-04-27 ENCOUNTER — Other Ambulatory Visit: Payer: Self-pay

## 2020-04-27 DIAGNOSIS — S301XXD Contusion of abdominal wall, subsequent encounter: Secondary | ICD-10-CM | POA: Diagnosis not present
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
## 2020-07-08 DIAGNOSIS — E034 Atrophy of thyroid (acquired): Secondary | ICD-10-CM | POA: Diagnosis not present

## 2020-07-08 DIAGNOSIS — M778 Other enthesopathies, not elsewhere classified: Secondary | ICD-10-CM | POA: Diagnosis not present

## 2020-07-08 DIAGNOSIS — E782 Mixed hyperlipidemia: Secondary | ICD-10-CM | POA: Diagnosis not present

## 2020-07-08 DIAGNOSIS — E119 Type 2 diabetes mellitus without complications: Secondary | ICD-10-CM | POA: Diagnosis not present

## 2020-08-04 ENCOUNTER — Ambulatory Visit
Admission: EM | Admit: 2020-08-04 | Discharge: 2020-08-04 | Disposition: A | Discharge status: Home / Self Care | Payer: PPO | Attending: Emergency Medicine | Admitting: Emergency Medicine

## 2020-08-04 ENCOUNTER — Other Ambulatory Visit: Payer: Self-pay

## 2020-08-04 DIAGNOSIS — S46912A Strain of unspecified muscle, fascia and tendon at shoulder and upper arm level, left arm, initial encounter: Secondary | ICD-10-CM | POA: Diagnosis not present

## 2020-08-04 MED ORDER — IBUPROFEN 600 MG PO TABS: 600.0000 mg | ORAL_TABLET | Freq: Four times a day (QID) | ORAL | 0 refills | Status: AC | PRN

## 2020-08-04 MED ORDER — BACLOFEN 10 MG PO TABS: 10.0000 mg | ORAL_TABLET | Freq: Three times a day (TID) | ORAL | 0 refills | Status: AC
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
## 2020-08-04 NOTE — Discharge Instructions (Addendum)
Take the ibuprofen, 600 mg every 6 hours with food, as needed for pain and inflammation.  Take the baclofen take the baclofen, 10 mg every 8 hours, as needed for muscle pain.  Talk with your primary care doctor about a referral to [facility] if you want to stay within the [place] system.  Follow-up with Dr. Roland Rack over at [facility] as they are part of [place].

## 2020-08-27 DIAGNOSIS — M79622 Pain in left upper arm: Secondary | ICD-10-CM | POA: Diagnosis not present

## 2020-08-27 DIAGNOSIS — S4992XD Unspecified injury of left shoulder and upper arm, subsequent encounter: Secondary | ICD-10-CM | POA: Diagnosis not present

## 2020-08-27 DIAGNOSIS — S4992XA Unspecified injury of left shoulder and upper arm, initial encounter: Secondary | ICD-10-CM | POA: Diagnosis not present

## 2020-12-29 DIAGNOSIS — E119 Type 2 diabetes mellitus without complications: Secondary | ICD-10-CM | POA: Diagnosis not present

## 2020-12-29 DIAGNOSIS — Z23 Encounter for immunization: Secondary | ICD-10-CM | POA: Diagnosis not present

## 2020-12-29 DIAGNOSIS — F32A Depression, unspecified: Secondary | ICD-10-CM | POA: Diagnosis not present
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
## 2021-11-07 ENCOUNTER — Ambulatory Visit
Admission: EM | Admit: 2021-11-07 | Discharge: 2021-11-07 | Disposition: A | Discharge status: Home / Self Care | Payer: PPO | Attending: Family Medicine | Admitting: Family Medicine

## 2021-11-07 ENCOUNTER — Ambulatory Visit (INDEPENDENT_AMBULATORY_CARE_PROVIDER_SITE_OTHER): Payer: PPO

## 2021-11-07 DIAGNOSIS — R1010 Upper abdominal pain, unspecified: Secondary | ICD-10-CM | POA: Diagnosis present

## 2021-11-07 DIAGNOSIS — R109 Unspecified abdominal pain: Secondary | ICD-10-CM

## 2021-11-07 LAB — CBC WITH DIFFERENTIAL/PLATELET
Abs Immature Granulocytes: 0.05 10*3/uL (ref 0.00–0.07)
Basophils Absolute: 0.1 10*3/uL (ref 0.0–0.1)
Basophils Relative: 1 %
Eosinophils Absolute: 0.1 10*3/uL (ref 0.0–0.5)
Eosinophils Relative: 1 %
HCT: 44.9 % (ref 36.0–46.0)
Hemoglobin: 14.3 g/dL (ref 12.0–15.0)
Immature Granulocytes: 0 %
Lymphocytes Relative: 23 %
Lymphs Abs: 2.9 10*3/uL (ref 0.7–4.0)
MCH: 28.3 pg (ref 26.0–34.0)
MCHC: 31.8 g/dL (ref 30.0–36.0)
MCV: 88.9 fL (ref 80.0–100.0)
Monocytes Absolute: 1.3 10*3/uL — ABNORMAL HIGH (ref 0.1–1.0)
Monocytes Relative: 10 %
Neutro Abs: 8.2 10*3/uL — ABNORMAL HIGH (ref 1.7–7.7)
Neutrophils Relative %: 65 %
Platelets: 455 10*3/uL — ABNORMAL HIGH (ref 150–400)
RBC: 5.05 MIL/uL (ref 3.87–5.11)
RDW: 13.1 % (ref 11.5–15.5)
WBC: 12.7 10*3/uL — ABNORMAL HIGH (ref 4.0–10.5)
nRBC: 0 % (ref 0.0–0.2)

## 2021-11-07 LAB — COMPREHENSIVE METABOLIC PANEL
ALT: 37 U/L (ref 0–44)
AST: 37 U/L (ref 15–41)
Albumin: 4.1 g/dL (ref 3.5–5.0)
Alkaline Phosphatase: 154 U/L — ABNORMAL HIGH (ref 38–126)
Anion gap: 9 (ref 5–15)
BUN: 15 mg/dL (ref 8–23)
CO2: 26 mmol/L (ref 22–32)
Calcium: 9.3 mg/dL (ref 8.9–10.3)
Chloride: 103 mmol/L (ref 98–111)
Creatinine, Ser: 0.67 mg/dL (ref 0.44–1.00)
GFR, Estimated: >60 mL/min (ref >60)
Glucose, Bld: 91 mg/dL (ref 70–99)
Potassium: 3.8 mmol/L (ref 3.5–5.1)
Sodium: 138 mmol/L (ref 135–145)
Total Bilirubin: 1.2 mg/dL (ref 0.3–1.2)
Total Protein: 7.4 g/dL (ref 6.5–8.1)

## 2021-11-07 LAB — LIPASE, BLOOD: Lipase: 32 U/L (ref 11–51)

## 2021-11-07 MED ORDER — PANTOPRAZOLE SODIUM 40 MG PO TBEC: 40.0000 mg | DELAYED_RELEASE_TABLET | Freq: Every day | ORAL | 0 refills | Status: AC

## 2021-11-07 NOTE — ED Triage Notes (Signed)
Patient reports abdomen pain, swolling and left side pain greater than 1 month. Patient reports nausea and the urge to burp and unable to. Patient states she take have been taking Ibuprofen daily due to abdomen pain.   Patients daughter states mom have been taking Ozempic x 2 years and wanted to know if its related.

## 2021-11-07 NOTE — Discharge Instructions (Signed)
Stop Ozempic.  Medication as prescribed.  We will call to schedule the scan (we will get authorization tomorrow).  If you worsen, go to the ER.  Take care  Dr. Lacinda Axon

## 2021-11-07 NOTE — ED Provider Notes (Signed)
[facility]    CSN: [id number] Arrival date & time: 11/07/21  1217  History   Chief Complaint Abdominal pain  HPI 66 year old female presents with ongoing abdominal pain.  Patient reports upper abdominal pain particular left upper abdominal pain for the past 1.5 months.  Has continued to persist and seems to be worsening.  Daughter states that she seems to downplay her symptoms.  She has had some ongoing issues with constipation.  No fever.  Daughter is concerned that Ozempic may be playing a role.  She states that her primary care provider is retired.  She has not seen a provider since March.  She has been taking ibuprofen frequently for the past 1.5 months to help with her abdominal pain.  No reports of vomiting.  No reports of hematochezia or melena.  Past Medical History:  Diagnosis Date   Cancer ([place])    basal cell, right scalp   Depression    Diabetes mellitus without complication ([place])    pre diabetic   Hypothyroidism    Sleep apnea     Patient Active Problem List   Diagnosis Date Noted   Endometrial polyp 10/06/2015   Endocervical polyp 10/06/2015    Past Surgical History:  Procedure Laterality Date   DILATATION & CURETTAGE/HYSTEROSCOPY WITH MYOSURE N/A 10/06/2015   Procedure: DILATATION & CURETTAGE/HYSTEROSCOPY WITH MYOSURE/polypectomy;  Surgeon: Will Bonnet, MD;  Location: [facility] ORS;  Service: Gynecology;  Laterality: N/A;   DILATION AND CURETTAGE OF UTERUS     SALPINGECTOMY     66 y.o.    OB History   No obstetric history on file.      Home Medications    Prior to Admission medications   Medication Sig Start Date End Date Taking? Authorizing Provider  pantoprazole (PROTONIX) 40 MG tablet Take 1 tablet (40 mg total) by mouth daily. 11/07/21  Yes Dawnisha Marquina G, DO  atorvastatin (LIPITOR) 20 MG tablet Take 20 mg by mouth daily at 6 PM.    [provider]  baclofen (LIORESAL) 10 MG tablet Take 1 tablet (10 mg total) by mouth 3  (three) times daily. 08/04/20   Margarette Canada, NP  citalopram (CELEXA) 40 MG tablet Take 40 mg by mouth daily. 08/27/15   [provider]  ibuprofen (ADVIL) 600 MG tablet Take 1 tablet (600 mg total) by mouth every 6 (six) hours as needed. 08/04/20   Margarette Canada, NP  levothyroxine (SYNTHROID, LEVOTHROID) 100 MCG tablet Take 100 mcg by mouth daily before breakfast.  08/27/15   [provider]  OZEMPIC, 0.25 OR 0.5 MG/DOSE, 2 MG/1.5ML SOPN SMARTSIG:0.187 Milliliter(s) SUB-Q Once a Week 03/24/20   [provider]  fenofibrate (TRICOR) 48 MG tablet Take 48 mg by mouth daily. 07/25/15 04/23/20  [provider]  phentermine 15 MG capsule Take 15 mg by mouth every morning.  04/23/20  [provider]   Social History Social History   Tobacco Use   Smoking status: Never   Smokeless tobacco: Never  Vaping Use   Vaping Use: Never used  Substance Use Topics   Alcohol use: No   Drug use: No     Allergies   Patient has no known allergies.   Review of Systems Review of Systems Per HPI  Physical Exam Triage Vital Signs ED Triage Vitals  Enc Vitals Group     BP 11/07/21 1234 (!) 140/80     Pulse Rate 11/07/21 1234 74     Resp 11/07/21 1234 16  Temp 11/07/21 1234 98.4 F (36.9 C)     Temp Source 11/07/21 1234 Oral     SpO2 11/07/21 1234 100 %     Weight 11/07/21 1239 151 lb (68.5 kg)     Height 11/07/21 1239 5' (1.524 m)     Head Circumference --      Peak Flow --      Pain Score 11/07/21 1237 7     Pain Loc --      Pain Edu? --      Excl. in [place]? --    Updated Vital Signs BP (!) 140/80 (BP Location: Left Arm)   Pulse 74   Temp 98.4 F (36.9 C) (Oral)   Resp 16   Ht 5' (1.524 m)   Wt 68.5 kg   SpO2 100%   BMI 29.49 kg/m   Visual Acuity Right Eye Distance:   Left Eye Distance:   Bilateral Distance:    Right Eye Near:   Left Eye Near:    Bilateral Near:     Physical Exam Vitals and nursing note reviewed.  Constitutional:       Appearance: Normal appearance.  HENT:     Head: Normocephalic and atraumatic.  Eyes:     General:        Right eye: No discharge.        Left eye: No discharge.     Conjunctiva/sclera: Conjunctivae normal.  Cardiovascular:     Rate and Rhythm: Normal rate and regular rhythm.  Pulmonary:     Effort: Pulmonary effort is normal.     Breath sounds: Normal breath sounds. No wheezing, rhonchi or rales.  Abdominal:     Comments: Soft, nondistended.  Tender to palpation in the left upper quadrant, right upper quadrant, and epigastric region.  Neurological:     Mental Status: She is alert.      UC Treatments / Results  Labs (all labs ordered are listed, but only abnormal results are displayed) Labs Reviewed  CBC WITH DIFFERENTIAL/PLATELET - Abnormal; Notable for the following components:      Result Value   WBC 12.7 (*)    Platelets 455 (*)    Neutro Abs 8.2 (*)    Monocytes Absolute 1.3 (*)    All other components within normal limits  COMPREHENSIVE METABOLIC PANEL - Abnormal; Notable for the following components:   Alkaline Phosphatase 154 (*)    All other components within normal limits  LIPASE, BLOOD    EKG   Radiology DG Abd 1 View  Result Date: 11/07/2021 CLINICAL DATA:  Nausea and abdominal pain for the past month. EXAM: ABDOMEN - 1 VIEW COMPARISON:  None Available. FINDINGS: The bowel gas pattern is normal. No radio-opaque calculi or other significant radiographic abnormality are seen. IMPRESSION: Negative. Electronically Signed   By: Titus Dubin M.D.   On: 11/07/2021 13:15    Procedures Procedures (including critical care time)  Medications Ordered in UC Medications - No data to display  Initial Impression / Assessment and Plan / UC Course  I have reviewed the triage vital signs and the nursing notes.  Pertinent labs & imaging results that were available during my care of the patient were reviewed by me and considered in my medical decision making (see chart  for details).    66 year old female presents with ongoing upper abdominal pain.  Laboratory studies revealed leukocytosis and elevated alkaline phosphatase.  KUB was obtained and was independent reviewed by me.  KUB normal.  I have  recommended patient have a CT scan for further evaluation given the duration of her abdominal pain and laboratory abnormalities.  We will contact her insurance company tomorrow and obtain prior authorization if needed.  Arranging outpatient.  I have recommended cessation of Ozempic for now.  Protonix as prescribed. Advised patient go to the ER if she worsens.  Final Clinical Impressions(s) / UC Diagnoses   Final diagnoses:  Upper abdominal pain     Discharge Instructions      Stop Ozempic.  Medication as prescribed.  We will call to schedule the scan (we will get authorization tomorrow).  If you worsen, go to the ER.  Take care  Dr. Lacinda Axon     ED Prescriptions     Medication Sig Dispense Auth. Provider   pantoprazole (PROTONIX) 40 MG tablet Take 1 tablet (40 mg total) by mouth daily. 90 tablet Thersa Salt G, DO      PDMP not reviewed this encounter.   Coral Spikes, [place] 11/07/21 1658

## 2022-01-21 DEATH — deceased
